# Patient Record
Sex: Male | Born: 1955 | Race: White | Hispanic: No | Marital: Married | Smoking: Never smoker
Health system: Southern US, Community
[De-identification: ages and names within clinical notes are randomized; demographics above are authoritative.]

## PROBLEM LIST (undated history)

## (undated) HISTORY — PX: HERNIA REPAIR: SHX51

---

## 2003-02-01 ENCOUNTER — Encounter: Admission: RE | Admit: 2003-02-01 | Discharge: 2003-02-01 | Payer: Self-pay | Admitting: Internal Medicine

## 2003-02-01 ENCOUNTER — Encounter: Payer: Self-pay | Admitting: Internal Medicine

## 2003-03-07 ENCOUNTER — Encounter: Payer: Self-pay | Admitting: Internal Medicine

## 2003-03-07 ENCOUNTER — Observation Stay (HOSPITAL_COMMUNITY): Admission: AD | Admit: 2003-03-07 | Discharge: 2003-03-09 | Payer: Self-pay | Admitting: Internal Medicine

## 2003-03-08 ENCOUNTER — Encounter: Payer: Self-pay | Admitting: Internal Medicine

## 2017-10-09 ENCOUNTER — Emergency Department (HOSPITAL_BASED_OUTPATIENT_CLINIC_OR_DEPARTMENT_OTHER)
Admission: EM | Admit: 2017-10-09 | Discharge: 2017-10-09 | Disposition: A | Discharge status: Home / Self Care | Payer: BC Managed Care – PPO | Attending: Emergency Medicine | Admitting: Emergency Medicine

## 2017-10-09 ENCOUNTER — Other Ambulatory Visit: Payer: Self-pay

## 2017-10-09 ENCOUNTER — Encounter (HOSPITAL_BASED_OUTPATIENT_CLINIC_OR_DEPARTMENT_OTHER): Payer: Self-pay | Admitting: Emergency Medicine

## 2017-10-09 ENCOUNTER — Emergency Department (HOSPITAL_BASED_OUTPATIENT_CLINIC_OR_DEPARTMENT_OTHER): Payer: BC Managed Care – PPO

## 2017-10-09 DIAGNOSIS — S61255A Open bite of left ring finger without damage to nail, initial encounter: Secondary | ICD-10-CM | POA: Diagnosis not present

## 2017-10-09 DIAGNOSIS — Y999 Unspecified external cause status: Secondary | ICD-10-CM | POA: Diagnosis not present

## 2017-10-09 DIAGNOSIS — Y929 Unspecified place or not applicable: Secondary | ICD-10-CM | POA: Insufficient documentation

## 2017-10-09 DIAGNOSIS — Y939 Activity, unspecified: Secondary | ICD-10-CM | POA: Diagnosis not present

## 2017-10-09 DIAGNOSIS — S61215A Laceration without foreign body of left ring finger without damage to nail, initial encounter: Secondary | ICD-10-CM

## 2017-10-09 DIAGNOSIS — W540XXA Bitten by dog, initial encounter: Secondary | ICD-10-CM | POA: Insufficient documentation

## 2017-10-09 MED ORDER — AMOXICILLIN-POT CLAVULANATE 875-125 MG PO TABS: 1.0000 | ORAL_TABLET | Freq: Two times a day (BID) | ORAL | 0 refills | Status: AC

## 2017-10-09 MED ORDER — LIDOCAINE HCL (PF) 1 % IJ SOLN
30.0000 mL | Freq: Once | INTRAMUSCULAR | Status: AC
  Administered 2017-10-09: 30 mL
  Filled 2017-10-09: qty 30

## 2017-10-09 MED ORDER — BACITRACIN ZINC 500 UNIT/GM EX OINT: TOPICAL_OINTMENT | Freq: Once | CUTANEOUS | Status: DC

## 2017-10-09 NOTE — ED Triage Notes (Signed)
Patient states that he was bit by his dog. The patient has a laceration to his left ring finger, bleeding controled

## 2017-10-09 NOTE — ED Notes (Signed)
Signature pad not working in room. Pt verbalized understanding of instructions and is agreeable to f/u with PCP.

## 2017-10-09 NOTE — ED Provider Notes (Signed)
[facility] EMERGENCY DEPARTMENT Provider Note   CSN: [id number] Arrival date & time: 10/09/17  1853     History   Chief Complaint Chief Complaint  Patient presents with  . Animal Bite    HPI Greg Matthews is a 62 y.o. male.  The history is provided by the patient and medical records. No language interpreter was used.  Animal Bite  Associated symptoms: no numbness    Greg Matthews is a 62 y.o. male who presents to the Emergency Department complaining of dog bite which occurred just prior to arrival.  Laceration to the left ring finger.  Patient states he was bitten by his own dog which is fully vaccinated.  His last tetanus shot was in the last 2 years.  No treatment prior to arrival.  Pain with certain movements.  No numbness, tingling or weakness.   History reviewed. No pertinent past medical history.  There are no active problems to display for this patient.   Past Surgical History:  Procedure Laterality Date  . HERNIA REPAIR         Home Medications    Prior to Admission medications   Medication Sig Start Date End Date Taking? Authorizing Provider  amoxicillin-clavulanate (AUGMENTIN) 875-125 MG tablet Take 1 tablet by mouth every 12 (twelve) hours. 10/09/17   Ward, Chase Picket, PA-C    Family History History reviewed. No pertinent family history.  Social History Social History   Tobacco Use  . Smoking status: Never Smoker  . Smokeless tobacco: Never Used  Substance Use Topics  . Alcohol use: No    Frequency: Never  . Drug use: No     Allergies   Patient has no known allergies.   Review of Systems Review of Systems  Musculoskeletal: Positive for myalgias.  Skin: Positive for wound.  Neurological: Negative for weakness and numbness.     Physical Exam Updated Vital Signs BP (!) 147/81 (BP Location: Right Arm)   Pulse 61   Temp 98.5 F (36.9 C) (Oral)   Resp 18   Ht 6\' 1"  (1.854 m)   Wt 104.3 kg (230 lb)   SpO2  100%   BMI 30.34 kg/m   Physical Exam  Constitutional: He appears well-developed and well-nourished. No distress.  HENT:  Head: Normocephalic and atraumatic.  Neck: Neck supple.  Cardiovascular: Normal rate, regular rhythm and normal heart sounds.  No murmur heard. Pulmonary/Chest: Effort normal and breath sounds normal. No respiratory distress. He has no wheezes. He has no rales.  Musculoskeletal:  Left hand with full ROM. Good cap refill of left index finger. Sensation intact. Good grip strength.   Neurological: He is alert.  Skin: Skin is warm and dry.  3 cm gaping laceration to the left ring finger.  Nursing note and vitals reviewed.    ED Treatments / Results  Labs (all labs ordered are listed, but only abnormal results are displayed) Labs Reviewed - No data to display  EKG  EKG Interpretation None       Radiology Dg Finger Ring Left  Result Date: 10/09/2017 CLINICAL DATA:  Left fourth finger dog bite. EXAM: LEFT RING FINGER 2+V COMPARISON:  None. FINDINGS: There is no evidence of fracture or dislocation. There is no evidence of arthropathy or other focal bone abnormality. Soft tissue defects are seen involving the volar region of the distal finger consistent with laceration. No radiopaque foreign body is noted. IMPRESSION: No fracture or dislocation is noted. Probable lacerations are seen involving the  volar soft tissues of the distal fourth finger. Electronically Signed   By: Lupita RaiderJames  Green Jr, M.D.   On: 10/09/2017 20:05    Procedures .Marland Kitchen.Laceration Repair Date/Time: 10/09/2017 8:58 PM Performed by: Ward, Chase PicketJaime Pilcher, PA-C Authorized by: Ward, Chase PicketJaime Pilcher, PA-C   Consent:    Consent obtained:  Verbal   Consent given by:  Patient   Risks discussed:  Pain, infection, poor cosmetic result and poor wound healing   Alternatives discussed:  Delayed treatment and observation Anesthesia (see MAR for exact dosages):    Anesthesia method:  Local infiltration   Local  anesthetic:  Lidocaine 1% w/o epi Laceration details:    Location:  Finger   Finger location:  L ring finger   Length (cm):  3 Repair type:    Repair type:  Simple Pre-procedure details:    Preparation:  Patient was prepped and draped in usual sterile fashion Exploration:    Hemostasis achieved with:  Direct pressure   Wound exploration: wound explored through full range of motion and entire depth of wound probed and visualized     Contaminated: no   Treatment:    Area cleansed with:  Saline   Amount of cleaning:  Standard   Irrigation solution:  Sterile saline Skin repair:    Repair method:  Sutures   Suture size:  5-0   Suture material:  Prolene   Suture technique:  Simple interrupted   Number of sutures:  2 Approximation:    Approximation:  Loose   Vermilion border: well-aligned   Post-procedure details:    Dressing:  Antibiotic ointment   Patient tolerance of procedure:  Tolerated well, no immediate complications   (including critical care time)  Medications Ordered in ED Medications  bacitracin ointment (not administered)  lidocaine (PF) (XYLOCAINE) 1 % injection 30 mL (30 mLs Infiltration Given by Other 10/09/17 1942)     Initial Impression / Assessment and Plan / ED Course  I have reviewed the triage vital signs and the nursing notes.  Pertinent labs & imaging results that were available during my care of the patient were reviewed by me and considered in my medical decision making (see chart for details).    Greg Matthews is a 62 y.o. male who presents to ED for dog bite just prior to arrival.  Dog was his own fully vaccinated animal.  No need for rabies vaccines.  His tetanus is already up-to-date.  Wound thoroughly irrigated and cleaned in emergency department tonight. NVI. Wound gaping open. Two very loose sutures placed. Will start him on Augmentin and have him follow up with his PCP. Home wound care instructions and return cautions discussed at length with  patient and wife at bedside.  All questions answered.   Final Clinical Impressions(s) / ED Diagnoses   Final diagnoses:  Dog bite, initial encounter  Laceration of left ring finger without foreign body without damage to nail, initial encounter    ED Discharge Orders        Ordered    amoxicillin-clavulanate (AUGMENTIN) 875-125 MG tablet  Every 12 hours     10/09/17 2044       Ward, Chase PicketJaime Pilcher, PA-C 10/09/17 2102    Tilden Fossaees, Elizabeth, MD 10/10/17 [phone]

## 2017-10-09 NOTE — Discharge Instructions (Addendum)
It was my pleasure taking care of you today!  Please take all of your antibiotics until finished!  Keep wound clean and dry.   Please call your primary doctor in the morning to schedule a follow up appointment for wound check and suture removal. Sutures will likely need to be removed in 7 days, but I would like your doctor to see how the wound is healing.   Return to ER for redness, pus draining from wound, worsening pain, new symptoms, any additional concerns.

## 2018-09-22 IMAGING — CR DG FINGER RING 2+V*L*
3 series · 3 of 3 positions shown · non-contrast
Comparison: None.

CLINICAL DATA: Left fourth finger dog bite.

EXAM:
LEFT RING FINGER 2+V

[x finger pa left]
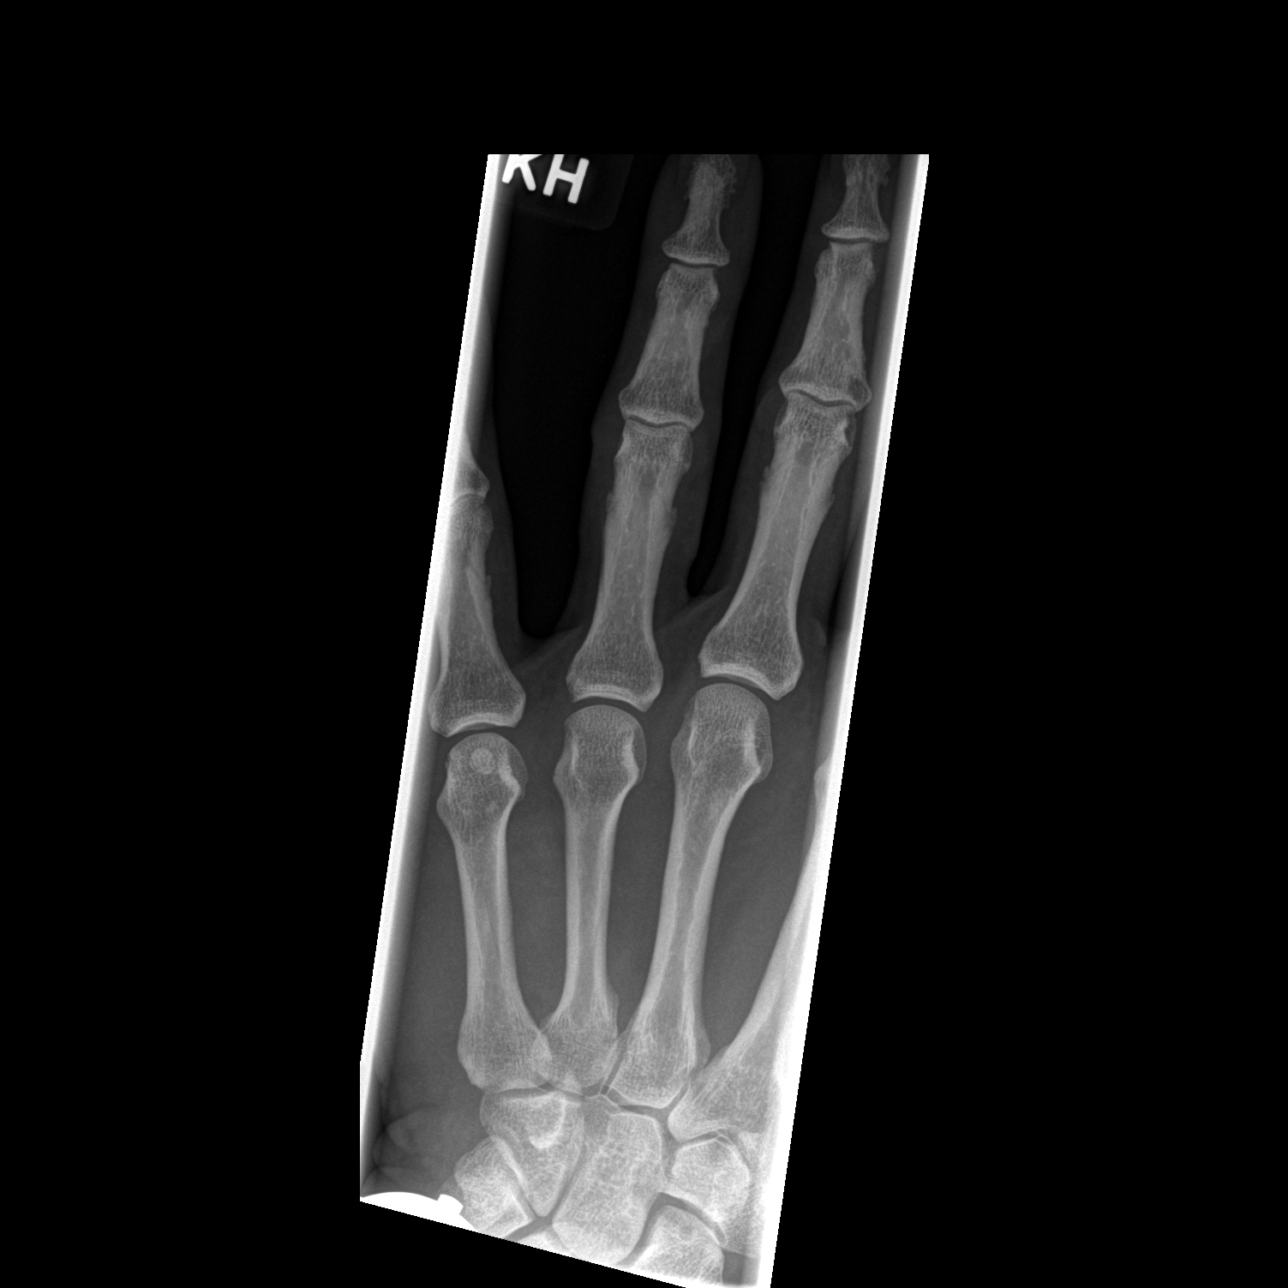

[x finger obl. left]
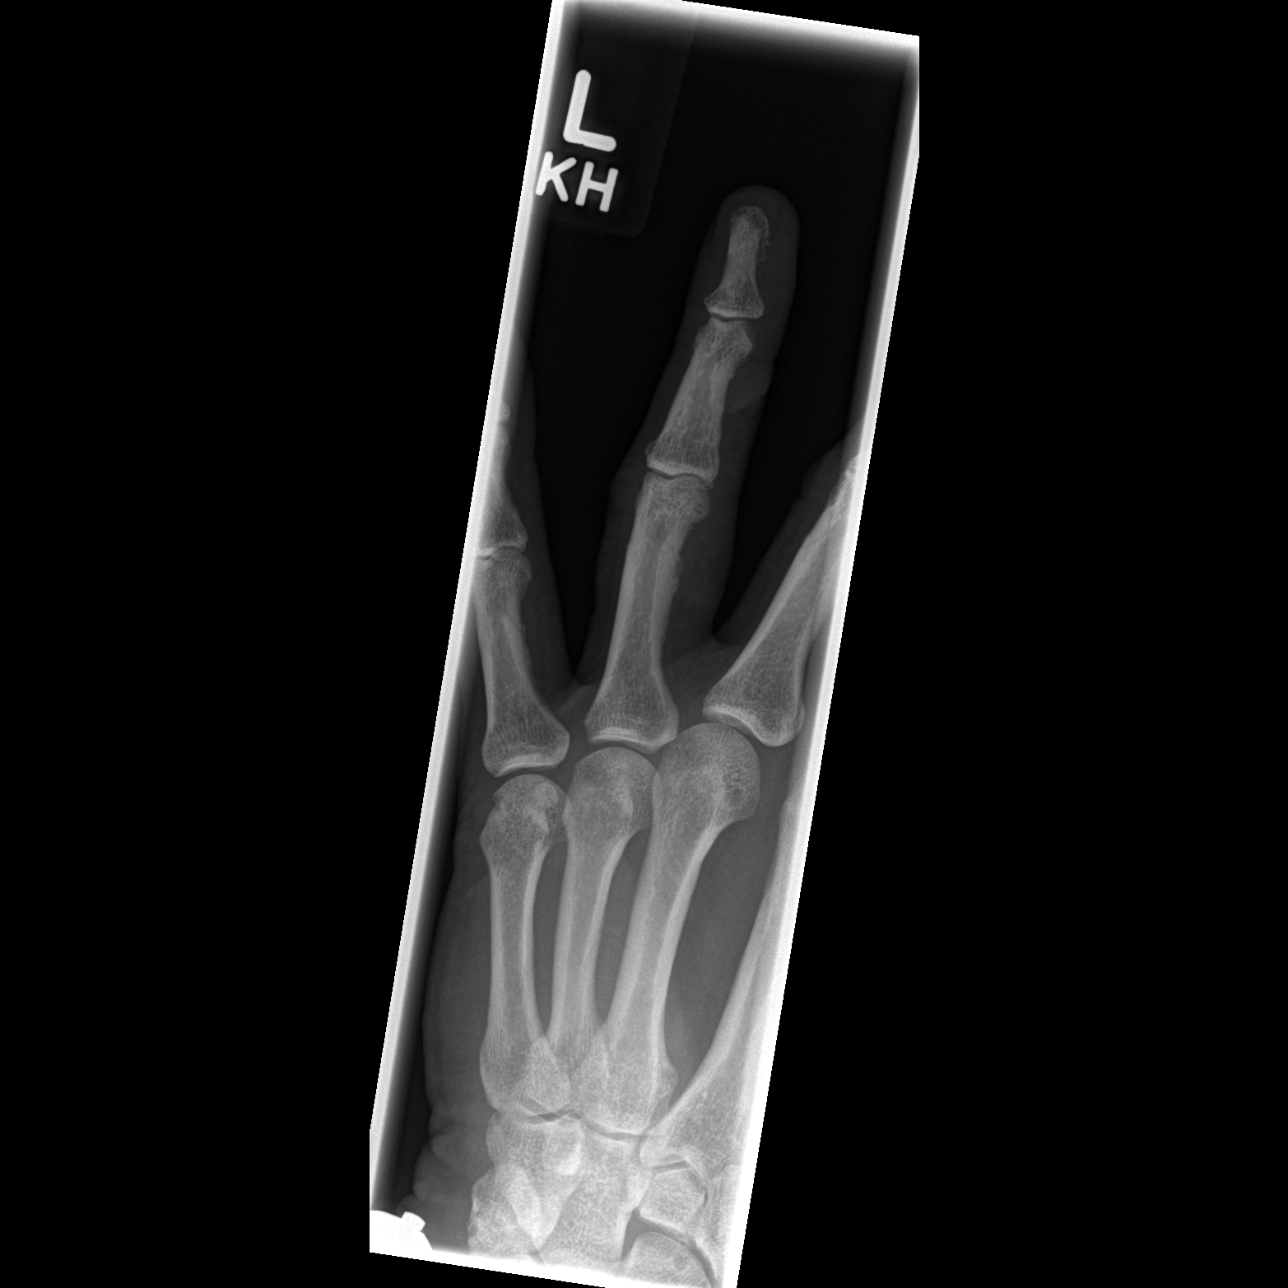

[x finger lateral left]
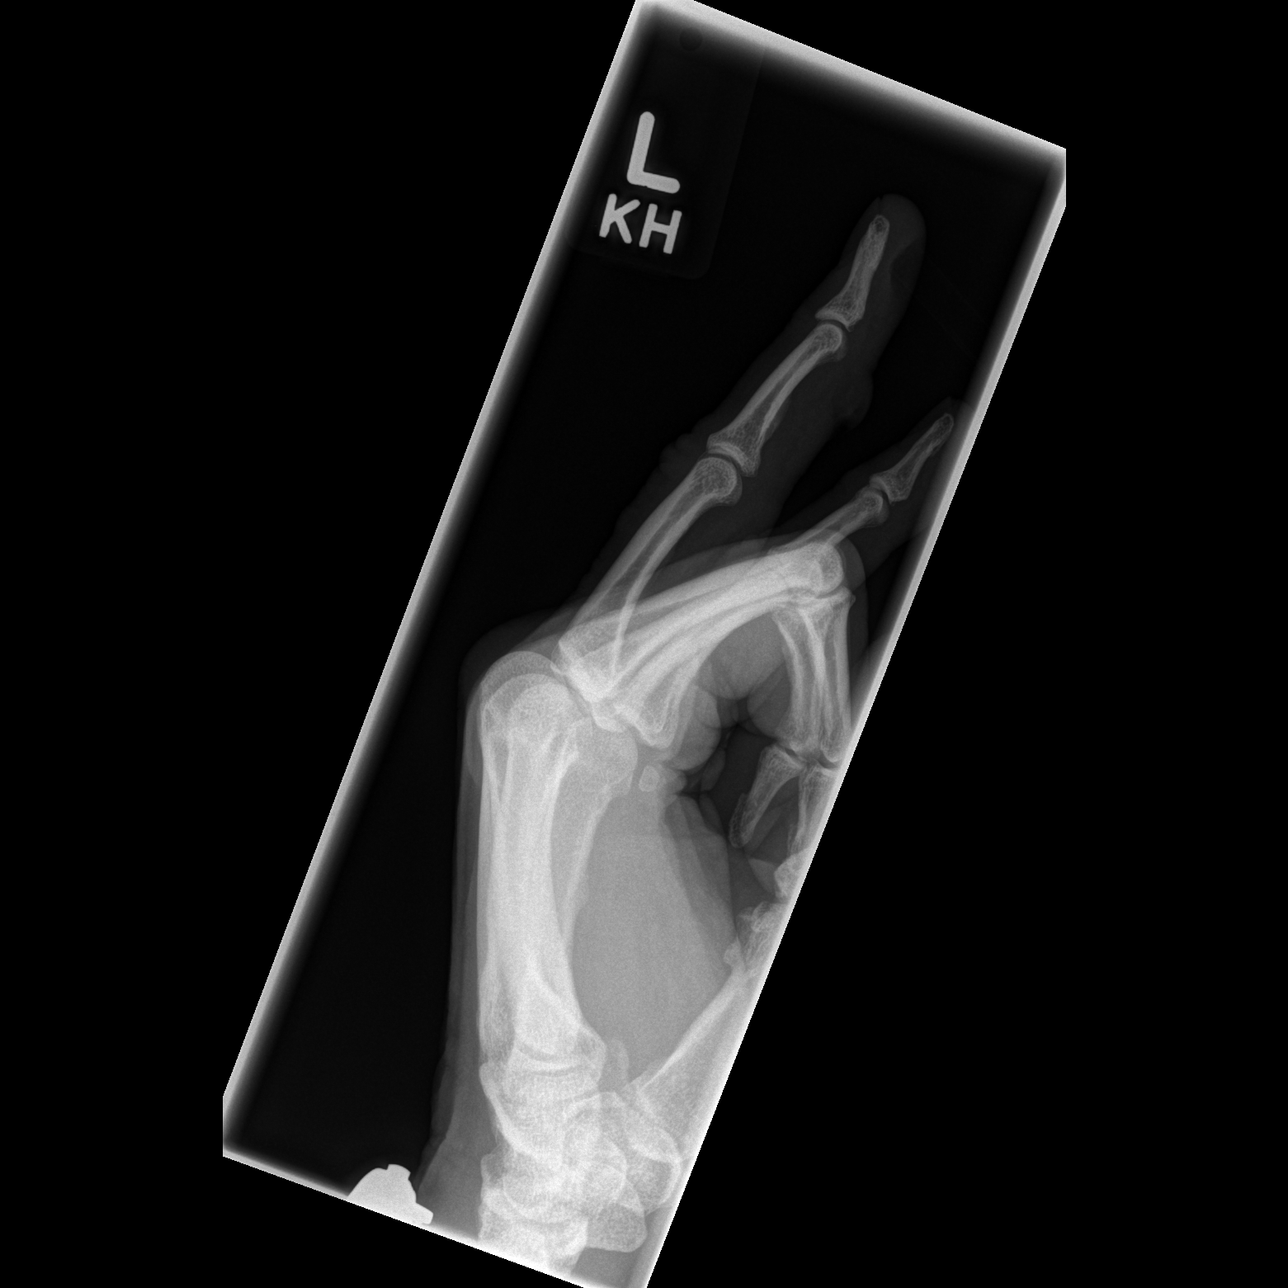

[3 of 3 positions shown; findings below may reference images not displayed]

FINDINGS: There is no evidence of fracture or dislocation. There is no
evidence of arthropathy or other focal bone abnormality. Soft tissue
defects are seen involving the volar region of the distal finger
consistent with laceration. No radiopaque foreign body is noted.
IMPRESSION: No fracture or dislocation is noted. Probable lacerations are seen
involving the volar soft tissues of the distal fourth finger.

## 2020-02-13 ENCOUNTER — Encounter (INDEPENDENT_AMBULATORY_CARE_PROVIDER_SITE_OTHER): Payer: Commercial Managed Care - PPO | Admitting: Ophthalmology

## 2020-02-14 ENCOUNTER — Ambulatory Visit (INDEPENDENT_AMBULATORY_CARE_PROVIDER_SITE_OTHER): Payer: Commercial Managed Care - PPO | Admitting: Ophthalmology

## 2020-02-14 ENCOUNTER — Encounter (INDEPENDENT_AMBULATORY_CARE_PROVIDER_SITE_OTHER): Payer: Commercial Managed Care - PPO | Admitting: Ophthalmology

## 2020-02-14 ENCOUNTER — Encounter (INDEPENDENT_AMBULATORY_CARE_PROVIDER_SITE_OTHER): Payer: Self-pay | Admitting: Ophthalmology

## 2020-02-14 ENCOUNTER — Other Ambulatory Visit: Payer: Self-pay

## 2020-02-14 DIAGNOSIS — H359 Unspecified retinal disorder: Secondary | ICD-10-CM | POA: Diagnosis not present

## 2020-02-14 DIAGNOSIS — H35021 Exudative retinopathy, right eye: Secondary | ICD-10-CM | POA: Insufficient documentation

## 2020-02-14 NOTE — Assessment & Plan Note (Signed)
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  To preserve visual function centrally.  Will need laser ablation of lesion superonasal and inferonasal to the optic nerve upon next visit.

## 2020-02-14 NOTE — Progress Notes (Signed)
02/14/2020     CHIEF COMPLAINT Patient presents for Retina Follow Up   HISTORY OF PRESENT ILLNESS: Greg Matthews is a 64 y.o. male who presents to the clinic today for:   HPI    Retina Follow Up    Patient presents with  Other.  In both eyes.  Duration of 6 months.  Since onset it is stable.          Comments    6 month follow up - FP OU Patient denies change in vision and overall has no complaints.        Last edited by Berenice Bouton on 02/14/2020  3:08 PM. (History)      Referring physician: Malka So., MD No address on file  HISTORICAL INFORMATION:   Selected notes from the MEDICAL RECORD NUMBER       CURRENT MEDICATIONS: No current outpatient medications on file. (Ophthalmic Drugs)   No current facility-administered medications for this visit. (Ophthalmic Drugs)   Current Outpatient Medications (Other)  Medication Sig   amLODipine (NORVASC) 5 MG tablet Take by mouth.   amoxicillin-clavulanate (AUGMENTIN) 875-125 MG tablet Take 1 tablet by mouth every 12 (twelve) hours. (Patient not taking: Reported on 02/14/2020)   No current facility-administered medications for this visit. (Other)      REVIEW OF SYSTEMS:    ALLERGIES No Known Allergies  PAST MEDICAL HISTORY History reviewed. No pertinent past medical history. Past Surgical History:  Procedure Laterality Date   HERNIA REPAIR      FAMILY HISTORY History reviewed. No pertinent family history.  SOCIAL HISTORY Social History   Tobacco Use   Smoking status: Never Smoker   Smokeless tobacco: Never Used  Substance Use Topics   Alcohol use: No   Drug use: No         OPHTHALMIC EXAM:  Base Eye Exam    Visual Acuity (Snellen - Linear)      Right Left   Dist cc 20/60-2 20/20-1   Dist ph cc NI    Correction: Glasses       Tonometry (Tonopen, 3:14 PM)      Right Left   Pressure 14 15       Pupils      Pupils Dark Light Shape React APD   Right PERRL 2 2 Round  Minimal None   Left PERRL 2 2 Round Minimal None       Visual Fields (Counting fingers)      Left Right    Full Full       Extraocular Movement      Right Left    Full Full       Neuro/Psych    Oriented x3: Yes   Mood/Affect: Normal       Dilation    Both eyes: 1.0% Mydriacyl, 2.5% Phenylephrine @ 3:14 PM        Slit Lamp and Fundus Exam    External Exam      Right Left   External Normal Normal       Slit Lamp Exam      Right Left   Lids/Lashes Normal Normal   Conjunctiva/Sclera White and quiet White and quiet   Cornea Clear Clear   Anterior Chamber Deep and quiet Deep and quiet   Iris Round and reactive Round and reactive   Lens 2+ Nuclear sclerosis 2+ Nuclear sclerosis   Anterior Vitreous Normal Normal       Fundus Exam  Right Left   Posterior Vitreous Normal Normal   Disc Normal Normal   C/D Ratio 0.2    Macula No active macular exudates nor thickening    Vessels No Coats disease with multifocal chorioretinal scarring and laser ablations in the past, blebs inferonasal to the nerve with a region of edema and circinate ring persisting inferonasal.  Similar lesion superonasal to the nerve.    Periphery Chorioretinal scars from previous laser ablation of leaky blebs of Coats disease           IMAGING AND PROCEDURES  Imaging and Procedures for 02/14/20  Color Fundus Photography Optos - OU - Both Eyes       Right Eye Progression has been stable. Disc findings include normal observations. Macula : microaneurysms.   Left Eye Progression has been stable.   Notes Old chorioretinal scar at nearly 360, blebs of oh Coats disease with perivascular edema as well as a circinate ring now slightly larger inferonasal and certainly not waning in the supra O nasal quadrant.  Will need laser ablation upon next visit.                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  To preserve  visual function centrally.  Will need laser ablation of lesion superonasal and inferonasal to the optic nerve upon next visit.      ICD-10-CM   1. 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 Retinal exudates and deposits  H35.9 Color Fundus Photography Optos - OU - Both Eyes    1.  2.  3.  Ophthalmic Meds Ordered this visit:  No orders of the defined types were placed in this encounter.      Return in about 6 months (around 08/16/2020) for dilate, OD, focal laser ablation of  lesions.  There are no Patient Instructions on file for this visit.   Explained the diagnoses, plan, and follow up with the patient and they expressed understanding.  Patient expressed understanding of the importance of proper follow up care.   Alford Highland Farhaan Mabee M.D. Diseases & Surgery of the Retina and Vitreous [facility] 02/14/20     Abbreviations: M myopia (nearsighted); A astigmatism; H hyperopia (farsighted); P presbyopia; Mrx spectacle prescription;  CTL contact lenses; OD right eye; OS left eye; OU both eyes  XT exotropia; ET esotropia; PEK punctate epithelial keratitis; PEE punctate epithelial erosions; DES dry eye syndrome; MGD meibomian gland dysfunction; ATs artificial tears; PFAT's preservative free artificial tears; NSC nuclear sclerotic cataract; PSC posterior subcapsular cataract; ERM epi-retinal membrane; PVD posterior vitreous detachment; RD retinal detachment; DM diabetes mellitus; DR diabetic retinopathy; NPDR non-proliferative diabetic retinopathy; PDR proliferative diabetic retinopathy; CSME clinically significant macular edema; DME diabetic macular edema; dbh dot blot hemorrhages; CWS cotton wool spot; POAG primary open angle glaucoma; C/D cup-to-disc ratio; HVF humphrey visual field; GVF goldmann visual field; OCT optical coherence tomography; IOP intraocular pressure; BRVO Branch retinal vein occlusion; CRVO central retinal vein  occlusion; CRAO central retinal artery occlusion; BRAO branch retinal artery occlusion; RT retinal tear; SB scleral buckle; PPV pars plana vitrectomy; VH Vitreous hemorrhage; PRP panretinal laser photocoagulation; IVK intravitreal kenalog; VMT vitreomacular traction; MH Macular hole;  NVD neovascularization of the disc; NVE neovascularization elsewhere; AREDS age related eye disease study; ARMD age related macular degeneration; POAG primary open angle glaucoma; EBMD epithelial/anterior basement membrane dystrophy; ACIOL anterior chamber intraocular lens; IOL intraocular  lens; PCIOL posterior chamber intraocular lens; Phaco/IOL phacoemulsification with intraocular lens placement; [place] photorefractive keratectomy; LASIK laser assisted in situ keratomileusis; HTN hypertension; DM diabetes mellitus; COPD chronic obstructive pulmonary disease

## 2020-07-24 ENCOUNTER — Encounter (INDEPENDENT_AMBULATORY_CARE_PROVIDER_SITE_OTHER): Payer: Self-pay | Admitting: Ophthalmology

## 2020-07-24 ENCOUNTER — Ambulatory Visit (INDEPENDENT_AMBULATORY_CARE_PROVIDER_SITE_OTHER): Payer: Commercial Managed Care - PPO | Admitting: Ophthalmology

## 2020-07-24 ENCOUNTER — Other Ambulatory Visit: Payer: Self-pay

## 2020-07-24 DIAGNOSIS — H35021 Exudative retinopathy, right eye: Secondary | ICD-10-CM | POA: Diagnosis not present

## 2020-07-24 NOTE — Progress Notes (Signed)
07/24/2020     CHIEF COMPLAINT Patient presents for Retina Follow Up (5 MO FU OD, POSS FOCAL OD////Pt reports stable vision OU, no new f/f, no pain or pressure. )   HISTORY OF PRESENT ILLNESS: Greg Matthews is a 64 y.o. male who presents to the clinic today for:   HPI    Retina Follow Up    Patient presents with  Other.  In right eye.  This started 5 months ago.  Duration of 5 months.  Since onset it is stable. Additional comments: 5 MO FU OD, POSS FOCAL OD    Pt reports stable vision OU, no new f/f, no pain or pressure.        Last edited by Varney Biles D on 07/24/2020  8:07 AM. (History)      Referring physician: Malka So., MD No address on file  HISTORICAL INFORMATION:   Selected notes from the MEDICAL RECORD NUMBER       CURRENT MEDICATIONS: No current outpatient medications on file. (Ophthalmic Drugs)   No current facility-administered medications for this visit. (Ophthalmic Drugs)   Current Outpatient Medications (Other)  Medication Sig  . amLODipine (NORVASC) 5 MG tablet Take by mouth.  Marland Kitchen amoxicillin-clavulanate (AUGMENTIN) 875-125 MG tablet Take 1 tablet by mouth every 12 (twelve) hours. (Patient not taking: Reported on 02/14/2020)   No current facility-administered medications for this visit. (Other)      REVIEW OF SYSTEMS:    ALLERGIES No Known Allergies  PAST MEDICAL HISTORY History reviewed. No pertinent past medical history. Past Surgical History:  Procedure Laterality Date  . HERNIA REPAIR      FAMILY HISTORY History reviewed. No pertinent family history.  SOCIAL HISTORY Social History   Tobacco Use  . Smoking status: Never Smoker  . Smokeless tobacco: Never Used  Substance Use Topics  . Alcohol use: No  . Drug use: No         OPHTHALMIC EXAM: Base Eye Exam    Visual Acuity (ETDRS)      Right Left   Dist cc 20/60 +2    Dist ph cc NI    Correction: Glasses       Tonometry (Tonopen, 8:13 AM)       Right Left   Pressure 17 23       Pupils      Dark Light Shape React APD   Right 2 2 Round Minimal None   Left 2 2 Round Minimal None       Visual Fields (Counting fingers)      Left Right    Full    Restrictions  Partial outer superior temporal, inferior temporal deficiencies       Extraocular Movement      Right Left    Full Full       Neuro/Psych    Oriented x3: Yes   Mood/Affect: Normal       Dilation    Right eye: 1.0% Mydriacyl, 2.5% Phenylephrine @ 8:14 AM        Slit Lamp and Fundus Exam    External Exam      Right Left   External Normal Normal       Slit Lamp Exam      Right Left   Lids/Lashes Normal Normal   Conjunctiva/Sclera White and quiet White and quiet   Cornea Clear Clear   Anterior Chamber Deep and quiet Deep and quiet   Iris Round and reactive Round and reactive  Lens 2+ Nuclear sclerosis 2+ Nuclear sclerosis   Anterior Vitreous Normal Normal       Fundus Exam      Right Left   Posterior Vitreous Normal    Disc Normal    C/D Ratio 0.2    Macula No active macular exudates nor thickening    Vessels No Coats disease with multifocal chorioretinal scarring and laser ablations in the past, blebs inferonasal to the nerve with a region of edema and circinate ring persisting inferonasal.  Similar lesion superonasal to the nerve.    Periphery Chorioretinal scars from previous laser ablation of leaky blebs of Coats disease           IMAGING AND PROCEDURES  Imaging and Procedures for 07/24/20  OCT, Retina - OU - Both Eyes       Right Eye Quality was good. Scan locations included subfoveal. Central Foveal Thickness: 249. Progression has been stable. Findings include abnormal foveal contour, subretinal scarring.   Left Eye Quality was good. Scan locations included subfoveal. Central Foveal Thickness: 321. Progression has been stable. Findings include normal foveal contour.        Focal Laser - OD - Right Eye       Time  Out Confirmed correct patient, procedure, site, and patient consented.   Anesthesia Topical anesthesia was used. Anesthetic medications included Proparacaine 0.5%.   Laser Information The type of laser was diode. Color was yellow. The duration in seconds was 0.2. The spot size was 300 microns. Laser power was 340. Total spots was 68.   Post-op The patient tolerated the procedure well. There were no complications. The patient received written and verbal post procedure care education.   Notes Focal laser treatment to the labs on retinal arteries and veins inferonasal and directly nasal to the optic nerve, treatment was delivered until line whitening of that in the lab occurred in order to blanch and potentially trigger occlusion of this area, small amount of green laser placed in the regions central to the circinate ring from these exudative areas                ASSESSMENT/PLAN:  Coats' disease, right Focal laser delivered today to exudative lesions nasal to the optic nerve , no complications      ICD-10-CM   1. Coats' disease, right  H35.021 OCT, Retina - OU - Both Eyes    Focal Laser - OD - Right Eye    1.  Focal laser completed as above to active areas of leakage nasal to the nerve  2.  3.  Ophthalmic Meds Ordered this visit:  No orders of the defined types were placed in this encounter.      Return in about 9 months (around 04/24/2021) for dilate, OD, COLOR FP.  There are no Patient Instructions on file for this visit.   Explained the diagnoses, plan, and follow up with the patient and they expressed understanding.  Patient expressed understanding of the importance of proper follow up care.   Alford Highland Mikhael Hendriks M.D. Diseases & Surgery of the Retina and Vitreous [facility] 07/24/20     Abbreviations: M myopia (nearsighted); A astigmatism; H hyperopia (farsighted); P presbyopia; Mrx spectacle prescription;  CTL contact lenses; OD right eye;  OS left eye; OU both eyes  XT exotropia; ET esotropia; PEK punctate epithelial keratitis; PEE punctate epithelial erosions; DES dry eye syndrome; MGD meibomian gland dysfunction; ATs artificial tears; PFAT's preservative free artificial tears; NSC nuclear sclerotic cataract;  PSC posterior subcapsular cataract; ERM epi-retinal membrane; PVD posterior vitreous detachment; RD retinal detachment; DM diabetes mellitus; DR diabetic retinopathy; NPDR non-proliferative diabetic retinopathy; PDR proliferative diabetic retinopathy; CSME clinically significant macular edema; DME diabetic macular edema; dbh dot blot hemorrhages; CWS cotton wool spot; POAG primary open angle glaucoma; C/D cup-to-disc ratio; HVF humphrey visual field; GVF goldmann visual field; OCT optical coherence tomography; IOP intraocular pressure; BRVO Branch retinal vein occlusion; CRVO central retinal vein occlusion; CRAO central retinal artery occlusion; BRAO branch retinal artery occlusion; RT retinal tear; SB scleral buckle; PPV pars plana vitrectomy; VH Vitreous hemorrhage; PRP panretinal laser photocoagulation; IVK intravitreal kenalog; VMT vitreomacular traction; MH Macular hole;  NVD neovascularization of the disc; NVE neovascularization elsewhere; AREDS age related eye disease study; ARMD age related macular degeneration; POAG primary open angle glaucoma; EBMD epithelial/anterior basement membrane dystrophy; ACIOL anterior chamber intraocular lens; IOL intraocular lens; PCIOL posterior chamber intraocular lens; Phaco/IOL phacoemulsification with intraocular lens placement; PRK photorefractive keratectomy; LASIK laser assisted in situ keratomileusis; HTN hypertension; DM diabetes mellitus; COPD chronic obstructive pulmonary disease

## 2020-07-24 NOTE — Assessment & Plan Note (Signed)
Focal laser delivered today to exudative lesions nasal to the optic nerve , no complications

## 2020-08-18 ENCOUNTER — Encounter (INDEPENDENT_AMBULATORY_CARE_PROVIDER_SITE_OTHER): Payer: Commercial Managed Care - PPO | Admitting: Ophthalmology

## 2021-04-27 ENCOUNTER — Encounter (INDEPENDENT_AMBULATORY_CARE_PROVIDER_SITE_OTHER): Payer: Self-pay | Admitting: Ophthalmology

## 2021-04-27 ENCOUNTER — Other Ambulatory Visit: Payer: Self-pay

## 2021-04-27 ENCOUNTER — Ambulatory Visit (INDEPENDENT_AMBULATORY_CARE_PROVIDER_SITE_OTHER): Payer: Commercial Managed Care - PPO | Admitting: Ophthalmology

## 2021-04-27 ENCOUNTER — Encounter (INDEPENDENT_AMBULATORY_CARE_PROVIDER_SITE_OTHER): Payer: Commercial Managed Care - PPO | Admitting: Ophthalmology

## 2021-04-27 DIAGNOSIS — H359 Unspecified retinal disorder: Secondary | ICD-10-CM | POA: Diagnosis not present

## 2021-04-27 DIAGNOSIS — H35021 Exudative retinopathy, right eye: Secondary | ICD-10-CM

## 2021-04-27 NOTE — Assessment & Plan Note (Signed)
No active blebs no active lesions no active exudative retinopathy at this time some 9 months post most recent focal laser ablation December 2021

## 2021-04-27 NOTE — Assessment & Plan Note (Signed)
Resolved condition post focal laser December 2021

## 2021-04-27 NOTE — Progress Notes (Signed)
04/27/2021     CHIEF COMPLAINT Patient presents for  Chief Complaint  Patient presents with   Retina Follow Up    5 MO FU OD, POSS FOCAL OD    Pt reports stable vision OU, no new f/f, no pain or pressure.       HISTORY OF PRESENT ILLNESS: Greg Matthews is a 65 y.o. male who presents to the clinic today for:   HPI     Retina Follow Up   Patient presents with  Other.  In right eye.  This started 9 months ago.  Duration of 9 months.  Since onset it is stable. Additional comments: 5 MO FU OD, POSS FOCAL OD    Pt reports stable vision OU, no new f/f, no pain or pressure.         Comments   9 mos fu od fp. Patient states vision is stable and unchanged since last visit. Denies any new floaters or FOL.       Last edited by Nelva Nay on 04/27/2021  8:08 AM.      Referring physician: Malka So., MD [address] Laurell Josephs [address] [place],  [place] [address]  HISTORICAL INFORMATION:   Selected notes from the MEDICAL RECORD NUMBER       CURRENT MEDICATIONS: No current outpatient medications on file. (Ophthalmic Drugs)   No current facility-administered medications for this visit. (Ophthalmic Drugs)   Current Outpatient Medications (Other)  Medication Sig   amLODipine (NORVASC) 5 MG tablet Take by mouth.   amoxicillin-clavulanate (AUGMENTIN) 875-125 MG tablet Take 1 tablet by mouth every 12 (twelve) hours. (Patient not taking: Reported on 02/14/2020)   No current facility-administered medications for this visit. (Other)      REVIEW OF SYSTEMS:    ALLERGIES No Known Allergies  PAST MEDICAL HISTORY History reviewed. No pertinent past medical history. Past Surgical History:  Procedure Laterality Date   HERNIA REPAIR      FAMILY HISTORY History reviewed. No pertinent family history.  SOCIAL HISTORY Social History   Tobacco Use   Smoking status: Never   Smokeless tobacco: Never  Substance Use Topics   Alcohol use: No   Drug  use: No         OPHTHALMIC EXAM:  Base Eye Exam     Visual Acuity (ETDRS)       Right Left   Dist cc 20/70 20/20 -1   Dist ph cc NI     Correction: Glasses         Tonometry (Tonopen, 8:13 AM)       Right Left   Pressure 17 18         Pupils       Dark Light React APD   Right 2 2 Minimal None   Left 2 2 Minimal None         Extraocular Movement       Right Left    Full Full         Neuro/Psych     Oriented x3: Yes   Mood/Affect: Normal         Dilation     Right eye: 1.0% Mydriacyl, 2.5% Phenylephrine @ 8:13 AM           Slit Lamp and Fundus Exam     External Exam       Right Left   External Normal Normal         Slit Lamp Exam  Right Left   Lids/Lashes Normal Normal   Conjunctiva/Sclera White and quiet White and quiet   Cornea Clear Clear   Anterior Chamber Deep and quiet Deep and quiet   Iris Round and reactive Round and reactive   Lens 2+ Nuclear sclerosis 2+ Nuclear sclerosis   Anterior Vitreous Normal Normal         Fundus Exam       Right Left   Posterior Vitreous Normal    Disc Normal    C/D Ratio 0.2    Macula No active macular exudates nor thickening    Vessels No Coats disease with multifocal chorioretinal scarring and laser ablations in the past, prior blebs nasal and inferonasal to the optic nerve treated July 2021 now completely resolved    Periphery Chorioretinal scars from previous laser ablation of leaky blebs of Coats disease             IMAGING AND PROCEDURES  Imaging and Procedures for 04/27/21  Color Fundus Photography Optos - OU - Both Eyes       Right Eye Progression has been stable. Disc findings include normal observations. Macula : microaneurysms.   Left Eye Progression has been stable.   Notes Chorioretinal scars nearly 360 posterior pole.  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 Retinal exudates and deposits  H35.9       1.  OD looks great, no signs of exudative retinopathy, resolved Coats disease with prior lesions which have been active now for some some years now completely resolved 9 months post focal ablation.  2.  In the absence of definable lesions today, we will asked patient to return for follow-up visit in 1 year  3.  Ophthalmic Meds Ordered this visit:  No orders of the defined types were placed in this encounter.      Return in about 1 year (around 04/27/2022) for DILATE OU, COLOR FP.  There are no Patient Instructions on file for this visit.   Explained the diagnoses, plan, and follow up with the patient and they expressed understanding.  Patient expressed understanding of the importance of proper follow up care.   Alford Highland Huriel Matt M.D. Diseases & Surgery of the Retina and Vitreous [facility] 04/27/21     Abbreviations: M myopia (nearsighted); A astigmatism; H hyperopia (farsighted); P presbyopia; Mrx spectacle prescription;  CTL contact lenses; OD right eye; OS left eye; OU both eyes  XT exotropia; ET esotropia; PEK punctate epithelial keratitis; PEE punctate epithelial erosions; DES dry eye syndrome; MGD meibomian gland dysfunction; ATs artificial tears; PFAT's preservative free artificial tears; NSC nuclear sclerotic cataract; PSC posterior subcapsular cataract; ERM epi-retinal membrane; PVD posterior vitreous detachment; RD retinal detachment; DM  diabetes mellitus; DR diabetic retinopathy; NPDR non-proliferative diabetic retinopathy; PDR proliferative diabetic retinopathy; CSME clinically significant macular edema; DME diabetic macular edema; dbh dot blot hemorrhages;  CWS cotton wool spot; POAG primary open angle glaucoma; C/D cup-to-disc ratio; HVF humphrey visual field; GVF goldmann visual field; OCT optical coherence tomography; IOP intraocular pressure; BRVO Branch retinal vein occlusion; CRVO central retinal vein occlusion; CRAO central retinal artery occlusion; BRAO branch retinal artery occlusion; RT retinal tear; SB scleral buckle; PPV pars plana vitrectomy; VH Vitreous hemorrhage; PRP panretinal laser photocoagulation; IVK intravitreal kenalog; VMT vitreomacular traction; MH Macular hole;  NVD neovascularization of the disc; NVE neovascularization elsewhere; AREDS age related eye disease study; ARMD age related macular degeneration; POAG primary open angle glaucoma; EBMD epithelial/anterior basement membrane dystrophy; ACIOL anterior chamber intraocular lens; IOL intraocular lens; PCIOL posterior chamber intraocular lens; Phaco/IOL phacoemulsification with intraocular lens placement; [place] photorefractive keratectomy; LASIK laser assisted in situ keratomileusis; HTN hypertension; DM diabetes mellitus; COPD chronic obstructive pulmonary disease

## 2022-04-27 ENCOUNTER — Ambulatory Visit (INDEPENDENT_AMBULATORY_CARE_PROVIDER_SITE_OTHER): Payer: Commercial Managed Care - PPO | Admitting: Ophthalmology

## 2022-04-27 ENCOUNTER — Encounter (INDEPENDENT_AMBULATORY_CARE_PROVIDER_SITE_OTHER): Payer: Self-pay | Admitting: Ophthalmology

## 2022-04-27 DIAGNOSIS — H2513 Age-related nuclear cataract, bilateral: Secondary | ICD-10-CM | POA: Diagnosis not present

## 2022-04-27 DIAGNOSIS — H35021 Exudative retinopathy, right eye: Secondary | ICD-10-CM | POA: Diagnosis not present

## 2022-04-27 DIAGNOSIS — H359 Unspecified retinal disorder: Secondary | ICD-10-CM | POA: Diagnosis not present

## 2022-04-27 NOTE — Assessment & Plan Note (Signed)
History of multiple chorioretinal scars from laser ablation of Coates type lesions many of which in the past retina macular function.  No active macular findings today.  Lesion nasal to nerve of no threat to the vision we will continue to monitor and observe

## 2022-04-27 NOTE — Progress Notes (Signed)
04/27/2022     CHIEF COMPLAINT Patient presents for  Chief Complaint  Patient presents with   Retina Follow Up      HISTORY OF PRESENT ILLNESS: Greg Matthews is a 66 y.o. male who presents to the clinic today for:   HPI     Retina Follow Up           Diagnosis: Other   Laterality: right eye   Severity: moderate   Course: stable         Comments   42YR FU OU FP. Pt stated vision has remained stable. Pt denies new floaters and FOL.       Last edited by Silvestre Moment on 04/27/2022  8:33 AM.      Referring physician: Lilian Coma., MD [address]een Mans [address] [place],  [place] [address]  HISTORICAL INFORMATION:   Selected notes from the MEDICAL RECORD NUMBER       CURRENT MEDICATIONS: No current outpatient medications on file. (Ophthalmic Drugs)   No current facility-administered medications for this visit. (Ophthalmic Drugs)   Current Outpatient Medications (Other)  Medication Sig   amLODipine (NORVASC) 5 MG tablet Take by mouth.   amoxicillin-clavulanate (AUGMENTIN) 875-125 MG tablet Take 1 tablet by mouth every 12 (twelve) hours. (Patient not taking: Reported on 02/14/2020)   No current facility-administered medications for this visit. (Other)      REVIEW OF SYSTEMS: ROS   Negative for: Constitutional, Gastrointestinal, Neurological, Skin, Genitourinary, Musculoskeletal, HENT, Endocrine, Cardiovascular, Eyes, Respiratory, Psychiatric, Allergic/Imm, Heme/Lymph Last edited by Silvestre Moment on 04/27/2022  8:33 AM.       ALLERGIES No Known Allergies  PAST MEDICAL HISTORY History reviewed. No pertinent past medical history. Past Surgical History:  Procedure Laterality Date   HERNIA REPAIR      FAMILY HISTORY History reviewed. No pertinent family history.  SOCIAL HISTORY Social History   Tobacco Use   Smoking status: Never   Smokeless tobacco: Never  Substance Use Topics   Alcohol use: No   Drug use: No         OPHTHALMIC  EXAM:  Base Eye Exam     Visual Acuity (ETDRS)       Right Left   Dist cc 20/80 -2 20/20   Dist ph cc 20/70 -2     Correction: Glasses         Tonometry (Tonopen, 8:38 AM)       Right Left   Pressure 16 17         Pupils       Pupils Dark Light Shape React APD   Right PERRL 2 2 Round Slow None   Left PERRL 2 2 Round Slow None         Visual Fields       Left Right    Full    Restrictions  Partial outer superior temporal, inferior temporal deficiencies         Extraocular Movement       Right Left    Full, Ortho Full, Ortho         Neuro/Psych     Oriented x3: Yes   Mood/Affect: Normal         Dilation     Both eyes: 1.0% Mydriacyl, 2.5% Phenylephrine @ 8:38 AM           Slit Lamp and Fundus Exam     External Exam       Right Left   External Normal  Normal         Slit Lamp Exam       Right Left   Lids/Lashes Normal Normal   Conjunctiva/Sclera White and quiet White and quiet   Cornea Clear Clear   Anterior Chamber Deep and quiet Deep and quiet   Iris Round and reactive Round and reactive   Lens 2+ Nuclear sclerosis 2+ Nuclear sclerosis   Anterior Vitreous Normal Normal         Fundus Exam       Right Left   Posterior Vitreous Normal Normal   Disc Normal Normal   C/D Ratio 0.2 0.1   Macula No active macular exudates nor thickening Normal   Vessels Old Coats disease with multifocal chorioretinal scarring and laser ablations in the past, prior blebs nasal and inferonasal to the optic nerve treated July 2021 now completely resolved, small active intraretinal hemorrhage and bleb inferonasal to nerve, no threat to vision will continue to observe Normal   Periphery Chorioretinal scars from previous laser ablation of leaky blebs of Coats disease Normal            IMAGING AND PROCEDURES  Imaging and Procedures for 04/27/22  Color Fundus Photography Optos - OU - Both Eyes       Right Eye Progression has been stable.  Disc findings include normal observations. Macula : microaneurysms.   Left Eye Progression has been stable.   Notes Chorioretinal scars nearly 360 posterior pole.  Most recent active coats like lesions were noted nasal and inferonasal and documented February 08, 2020, and treated with focal laser ablation December 2021 now completely resolved  status post focal ablation at that time  OD, watch nasal to nerve, bleb juxtapapillary but no threat to macular functioning no threat to peripheral retina.  We will continue to monitor and observe.             ASSESSMENT/PLAN:  Coats' disease, right History of multiple chorioretinal scars from laser ablation of Coates type lesions many of which in the past retina macular function.  No active macular findings today.  Lesion nasal to nerve of no threat to the vision we will continue to monitor and observe  Retinal exudates and deposits Small hemorrhage lesion nasal to nerve  Age-related nuclear cataract of both eyes The nature of cataract was discussed with the patient as well as the elective nature of surgery. The patient was reassured that surgery at a later date does not put the patient at risk for a worse outcome. It was emphasized that the need for surgery is dictated by the patient's quality of life as influenced by the cataract. Patient was instructed to maintain close follow up with their general eye care doctor.    Currently patient without significant symptoms.  Nighttime difficulties and/or dark rainy day symptoms were discussed with the patient so he would know if those symptoms develop it would be from the early color change.  No cloudiness to the lenses at this time     ICD-10-CM   1. 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 Retinal exudates and deposits  H35.9     3. Age-related nuclear cataract of both eyes  H25.13       1.  OD stable Coats disease year-over-year.  We will  continue to monitor and observe.  2.  Acuity stable OD  3.  OS entirely normal as expected  4.  [place] cataract changes  minimal to minor at this time observe  Ophthalmic Meds Ordered this visit:  No orders of the defined types were placed in this encounter.      Return in about 1 year (around 04/28/2023) for DILATE OU, COLOR FP, OCT.  There are no Patient Instructions on file for this visit.   Explained the diagnoses, plan, and follow up with the patient and they expressed understanding.  Patient expressed understanding of the importance of proper follow up care.   Greg Matthews M.D. Diseases & Surgery of the Retina and Vitreous [facility] 04/27/22     Abbreviations: M myopia (nearsighted); A astigmatism; H hyperopia (farsighted); P presbyopia; Mrx spectacle prescription;  CTL contact lenses; OD right eye; OS left eye; OU both eyes  XT exotropia; ET esotropia; PEK punctate epithelial keratitis; PEE punctate epithelial erosions; DES dry eye syndrome; MGD meibomian gland dysfunction; ATs artificial tears; PFAT's preservative free artificial tears; [place] nuclear sclerotic cataract; PSC posterior subcapsular cataract; ERM epi-retinal membrane; PVD posterior vitreous detachment; RD retinal detachment; DM diabetes mellitus; DR diabetic retinopathy; NPDR non-proliferative diabetic retinopathy; PDR proliferative diabetic retinopathy; CSME clinically significant macular edema; DME diabetic macular edema; dbh dot blot hemorrhages; CWS cotton wool spot; POAG primary open angle glaucoma; C/D cup-to-disc ratio; HVF humphrey visual field; GVF goldmann visual field; OCT optical coherence tomography; IOP intraocular pressure; BRVO Branch retinal vein occlusion; CRVO central retinal vein occlusion; CRAO central retinal artery occlusion; BRAO branch retinal artery occlusion; RT retinal tear; SB scleral buckle; PPV pars plana vitrectomy; VH Vitreous hemorrhage; PRP panretinal laser  photocoagulation; IVK intravitreal kenalog; VMT vitreomacular traction; MH Macular hole;  NVD neovascularization of the disc; NVE neovascularization elsewhere; AREDS age related eye disease study; ARMD age related macular degeneration; POAG primary open angle glaucoma; EBMD epithelial/anterior basement membrane dystrophy; ACIOL anterior chamber intraocular lens; IOL intraocular lens; PCIOL posterior chamber intraocular lens; Phaco/IOL phacoemulsification with intraocular lens placement; [place] photorefractive keratectomy; LASIK laser assisted in situ keratomileusis; HTN hypertension; DM diabetes mellitus; COPD chronic obstructive pulmonary disease

## 2022-04-27 NOTE — Assessment & Plan Note (Signed)
The nature of cataract was discussed with the patient as well as the elective nature of surgery. The patient was reassured that surgery at a later date does not put the patient at risk for a worse outcome. It was emphasized that the need for surgery is dictated by the patient's quality of life as influenced by the cataract. Patient was instructed to maintain close follow up with their general eye care doctor.    Currently patient without significant symptoms.  Nighttime difficulties and/or dark rainy day symptoms were discussed with the patient so he would know if those symptoms develop it would be from the early color change.  No cloudiness to the lenses at this time

## 2022-04-27 NOTE — Assessment & Plan Note (Signed)
Small hemorrhage lesion nasal to nerve

## 2023-04-28 ENCOUNTER — Encounter (INDEPENDENT_AMBULATORY_CARE_PROVIDER_SITE_OTHER): Payer: Medicare HMO | Admitting: Ophthalmology
